# Patient Record
Sex: Female | Born: 2000
Health system: Southern US, Community
[De-identification: ages and names within clinical notes are randomized; demographics above are authoritative.]

## PROBLEM LIST (undated history)

## (undated) DIAGNOSIS — L709 Acne, unspecified: Secondary | ICD-10-CM

## (undated) HISTORY — DX: Acne, unspecified: L70.9

---
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
## 2023-03-30 ENCOUNTER — Emergency Department (HOSPITAL_BASED_OUTPATIENT_CLINIC_OR_DEPARTMENT_OTHER)
Admission: EM | Admit: 2023-03-30 | Discharge: 2023-03-30 | Disposition: A | Discharge status: Home / Self Care | Payer: Self-pay | Attending: Emergency Medicine | Admitting: Emergency Medicine

## 2023-03-30 ENCOUNTER — Encounter (HOSPITAL_BASED_OUTPATIENT_CLINIC_OR_DEPARTMENT_OTHER): Payer: Self-pay | Admitting: Urology

## 2023-03-30 ENCOUNTER — Emergency Department (HOSPITAL_BASED_OUTPATIENT_CLINIC_OR_DEPARTMENT_OTHER): Payer: Self-pay

## 2023-03-30 ENCOUNTER — Other Ambulatory Visit: Payer: Self-pay

## 2023-03-30 DIAGNOSIS — R002 Palpitations: Secondary | ICD-10-CM

## 2023-03-30 DIAGNOSIS — F419 Anxiety disorder, unspecified: Secondary | ICD-10-CM | POA: Insufficient documentation

## 2023-03-30 DIAGNOSIS — R079 Chest pain, unspecified: Secondary | ICD-10-CM

## 2023-03-30 LAB — HCG, QUANTITATIVE, PREGNANCY: hCG, Beta Chain, Quant, S: <1 m[IU]/mL (ref <5)

## 2023-03-30 LAB — BASIC METABOLIC PANEL
Anion gap: 9 (ref 5–15)
BUN: 12 mg/dL (ref 6–20)
CO2: 23 mmol/L (ref 22–32)
Calcium: 9.7 mg/dL (ref 8.9–10.3)
Chloride: 104 mmol/L (ref 98–111)
Creatinine, Ser: 0.9 mg/dL (ref 0.44–1.00)
GFR, Estimated: >60 mL/min (ref >60)
Glucose, Bld: 78 mg/dL (ref 70–99)
Potassium: 3.8 mmol/L (ref 3.5–5.1)
Sodium: 136 mmol/L (ref 135–145)

## 2023-03-30 LAB — CBC
HCT: 45.2 % (ref 36.0–46.0)
Hemoglobin: 16 g/dL — ABNORMAL HIGH (ref 12.0–15.0)
MCH: 29.5 pg (ref 26.0–34.0)
MCHC: 35.4 g/dL (ref 30.0–36.0)
MCV: 83.2 fL (ref 80.0–100.0)
Platelets: 367 10*3/uL (ref 150–400)
RBC: 5.43 MIL/uL — ABNORMAL HIGH (ref 3.87–5.11)
RDW: 12.6 % (ref 11.5–15.5)
WBC: 7.7 10*3/uL (ref 4.0–10.5)
nRBC: 0 % (ref 0.0–0.2)

## 2023-03-30 LAB — MAGNESIUM: Magnesium: 2 mg/dL (ref 1.7–2.4)

## 2023-03-30 LAB — TROPONIN I (HIGH SENSITIVITY): Troponin I (High Sensitivity): <2 ng/L (ref <18)

## 2023-03-30 LAB — D-DIMER, QUANTITATIVE: D-Dimer, Quant: <0.27 ug{FEU}/mL (ref 0.00–0.50)

## 2023-03-30 NOTE — ED Triage Notes (Signed)
 Pt states central shocking pain to central chest that has been on and off x 1 week  Denies pain at this time  Denies fever or cough   No previous cardiac history

## 2023-03-30 NOTE — ED Provider Notes (Signed)
 [place] [facility] AT [facility] Provider Note   CSN: [id number] Arrival date & time: 03/30/23  1313     History  Chief Complaint  Patient presents with   Chest Pain    Melanie Horne is a 23 y.o. female with hx of anxiety and depression who presents to the ED complaining of intermittent chest pain x 1 week. Feels like "shocking" pain in the center of her chest, with associated palpitations, lightheadedness, and increased anxiety. No pain at present. Denies any other associated symptoms. Denies any prior cardiac hx.   Patient vapes "quite a bit".  Also endorses frequent caffeine use.  Denies any recent long travel.  She is on OCPs.  Denies history of blood clots.   Chest Pain Associated symptoms: palpitations        Home Medications Prior to Admission medications   Medication Sig Start Date End Date Taking? Authorizing Provider  cetirizine (ZYRTEC) 10 MG tablet Take 10 mg by mouth daily. 10/02/20   [provider]  famotidine (PEPCID) 20 MG tablet Take 20 mg by mouth 2 (two) times daily. 10/02/20   [provider]  FLUoxetine (PROZAC) 20 MG tablet Take 1 tablet (20 mg total) by mouth daily. 10/04/20   Wynn Banker, MD  hydrOXYzine (ATARAX/VISTARIL) 25 MG tablet Take 1 tablet (25 mg total) by mouth 3 (three) times daily as needed. 10/04/20   Wynn Banker, MD  metroNIDAZOLE (FLAGYL) 500 MG tablet Take 1 tablet (500 mg total) by mouth 2 (two) times daily. 12/03/20   Blue, Soijett A, PA-C  Norgestimate-Ethinyl Estradiol Triphasic (TRI-ESTARYLLA) 0.18/0.215/0.25 MG-35 MCG tablet Take 1 tablet by mouth daily. 12/27/21   Karie Georges, MD  tretinoin (RETIN-A) 0.1 % cream Apply topically at bedtime. 02/02/21   Burchette, Elberta Fortis, MD      Allergies    Patient has no known allergies.    Review of Systems   Review of Systems  Cardiovascular:  Positive for chest pain and palpitations.  Neurological:  Positive for  light-headedness.  Psychiatric/Behavioral:  The patient is nervous/anxious.   All other systems reviewed and are negative.   Physical Exam Updated Vital Signs BP 107/82   Pulse 82   Temp 97.6 F (36.4 C) (Oral)   Resp 14   Ht 5\' 5"  (1.651 m)   Wt 58.8 kg   LMP 03/27/2023   SpO2 100%   BMI 21.57 kg/m  Physical Exam Vitals and nursing note reviewed.  Constitutional:      Appearance: Normal appearance.  HENT:     Head: Normocephalic and atraumatic.  Eyes:     Conjunctiva/sclera: Conjunctivae normal.  Cardiovascular:     Rate and Rhythm: Regular rhythm. Tachycardia present.  Pulmonary:     Effort: Pulmonary effort is normal. No respiratory distress.     Breath sounds: Normal breath sounds.  Abdominal:     General: There is no distension.     Palpations: Abdomen is soft.     Tenderness: There is no abdominal tenderness.  Musculoskeletal:     Right lower leg: No edema.     Left lower leg: No edema.  Skin:    General: Skin is warm and dry.  Neurological:     General: No focal deficit present.     Mental Status: She is alert.  Psychiatric:        Mood and Affect: Affect normal. Mood is anxious.     ED Results / Procedures / Treatments  Labs (all labs ordered are listed, but only abnormal results are displayed) Labs Reviewed  CBC - Abnormal; Notable for the following components:      Result Value   RBC 5.43 (*)    Hemoglobin 16.0 (*)    All other components within normal limits  BASIC METABOLIC PANEL  MAGNESIUM  HCG, QUANTITATIVE, PREGNANCY  D-DIMER, QUANTITATIVE  TROPONIN I (HIGH SENSITIVITY)    EKG EKG Interpretation Date/Time:  Sunday March 30 2023 13:24:44 EDT Ventricular Rate:  89 PR Interval:  105 QRS Duration:  103 QT Interval:  350 QTC Calculation: 426 R Axis:   85  Text Interpretation: Sinus rhythm Short PR interval Right atrial enlargement Borderline Q waves in lateral leads Minimal ST depression, inferior leads Baseline wander in lead(s) II  III aVF V4 No previous ECGs available Confirmed by Alvira Monday ([id number]) on 03/30/2023 2:46:05 PM  Radiology DG Chest 2 View Result Date: 03/30/2023 CLINICAL DATA:  Chest pain. EXAM: CHEST - 2 VIEW COMPARISON:  Two-view chest x-ray 11/21/2005 FINDINGS: The heart size and mediastinal contours are within normal limits. Both lungs are clear. The visualized skeletal structures are unremarkable. IMPRESSION: No acute cardiopulmonary disease. Electronically Signed   By: Marin Roberts M.D.   On: 03/30/2023 13:45    Procedures Procedures  Discussed smoking cessation with patient and was they were offerred resources to help stop.  Total time was 5 min CPT code 60454.   Medications Ordered in ED Medications - No data to display  ED Course/ Medical Decision Making/ A&P                                 Medical Decision Making Amount and/or Complexity of Data Reviewed Labs: ordered. Radiology: ordered.  This patient is a 23 y.o. female  who presents to the ED for concern of chest pain, palpitations.   Differential diagnoses prior to evaluation: The emergent differential diagnosis includes, but is not limited to,  Cardiac arrhythmias, ACS, CHF, pericarditis, valvular disease, panic/anxiety, ETOH, stimulant use, medication side effect, anemia, hyperthyroidism, pulmonary embolism. This is not an exhaustive differential.   Past Medical History / Co-morbidities / Social History: Anxiety, depression Tobacco use  Physical Exam: Physical exam performed. The pertinent findings include: Tachycardic, otherwise normal vital signs.  No acute distress.  Lung sounds clear.  No peripheral edema.  Lab Tests/Imaging studies: I personally interpreted labs/imaging and the pertinent results include: CBC with mildly elevated hemoglobin, BMP normal.  Normal magnesium.  Negative troponin.  Negative D-dimer. Negative pregnancy.  Chest x-ray without acute abnormalities I agree with the radiologist  interpretation.  Cardiac monitoring: EKG obtained and interpreted by myself and attending physician which shows: sinus rhythm   Disposition: After consideration of the diagnostic results and the patients response to treatment, I feel that emergency department workup does not suggest an emergent condition requiring admission or immediate intervention beyond what has been performed at this time. The plan is: discharge with cardiology follow up.  With patient having as significant symptoms that she is, I think that she would benefit from wearing a Holter monitor.  Discussed that she could follow-up with her PCP regarding this, but she has not seen her PCP in quite some time, getting cardiology referral.  Encouraged PCP follow up to include possible thyroid testing, but overall I have low concern for emergent endocrine pathology as patient is clinically very well-appearing, reassuring vital signs and laboratory evaluation.  Discussed decreasing  tobacco/vape and caffeine usage, as well as stress reduction.  The patient is safe for discharge and has been instructed to return immediately for worsening symptoms, change in symptoms or any other concerns.  Final Clinical Impression(s) / ED Diagnoses Final diagnoses:  Nonspecific chest pain  Palpitations    Rx / DC Orders ED Discharge Orders          Ordered    Ambulatory referral to Cardiology       Comments: If you have not heard from the Cardiology office within the next 72 hours please call [phone].   03/30/23 1506           Portions of this report may have been transcribed using voice recognition software. Every effort was made to ensure accuracy; however, inadvertent computerized transcription errors may be present.    Su Monks, PA-C 03/30/23 1524    Alvira Monday, MD 04/07/23 1511

## 2023-03-30 NOTE — Discharge Instructions (Addendum)
 You were seen in the emergency department today for chest pain and palpitations.  As we discussed all your lab work and imaging looked very reassuring today.  We did not find any evidence of heart attack, blood clot in your lungs, anemia, or any electrolyte disturbance.  I recommend following up with your primary care doctor as well as a cardiologist, and have sent a referral for you.  Your primary care doctor can order some other testing such as thyroid studies.  There are certain lifestyle modifications that you can make that can help with your symptoms, such as cutting back on vaping/tobacco use, caffeine use, and reducing stress.  Continue to monitor how you're doing and return to the ER for new or worsening symptoms.

## 2023-05-06 IMAGING — CT CT RENAL STONE PROTOCOL
2 of 4 series · 16 of 46 positions shown, 18 images · non-contrast
Comparison: None.

CLINICAL DATA: Left flank pain, dysuria

EXAM:
CT ABDOMEN AND PELVIS WITHOUT CONTRAST
TECHNIQUE: Multidetector CT imaging of the abdomen and pelvis was performed
following the standard protocol without IV contrast.

[Series 2: axial st · axial · 0.62mm/px · z∈[+916,+1346]mm · 13 of 98 slices shown, 15 images]
[im 6/98  soft-tissue]
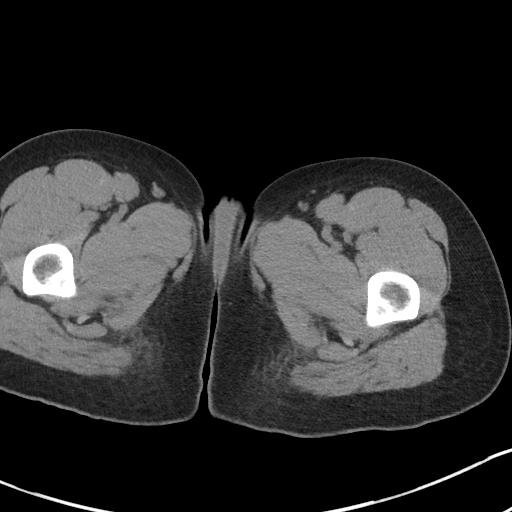
[im 6/98  bone]
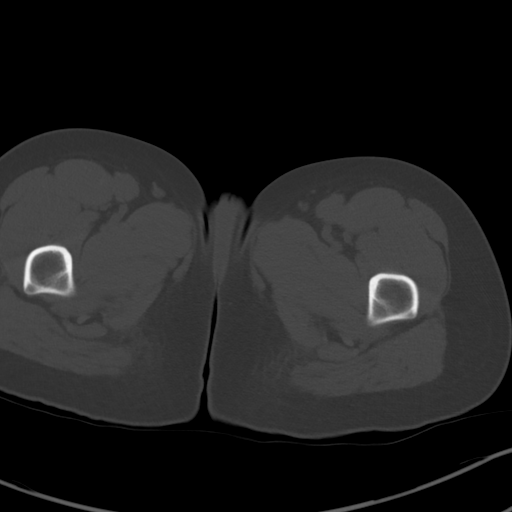
[im 11/98  soft-tissue]
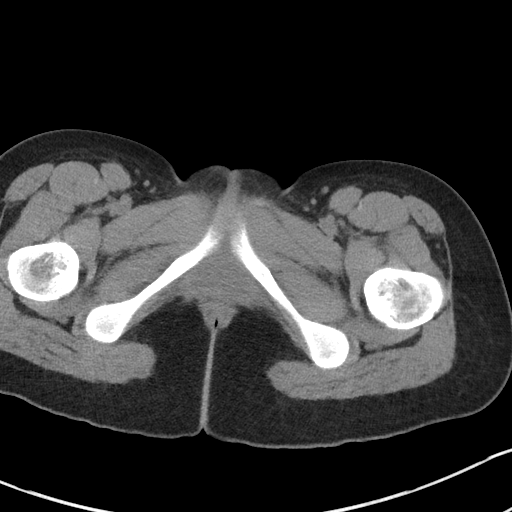
[im 22/98  soft-tissue]
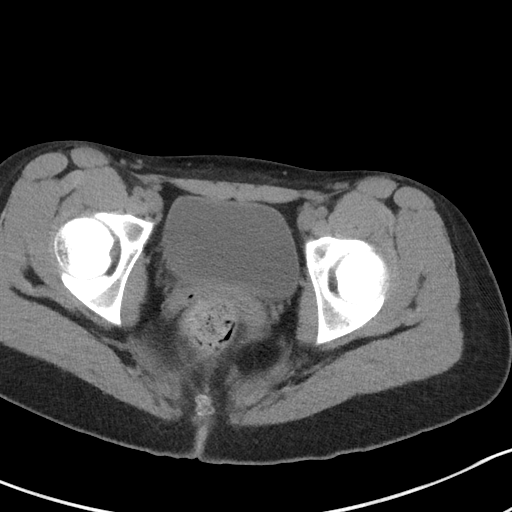
[im 27/98  soft-tissue]
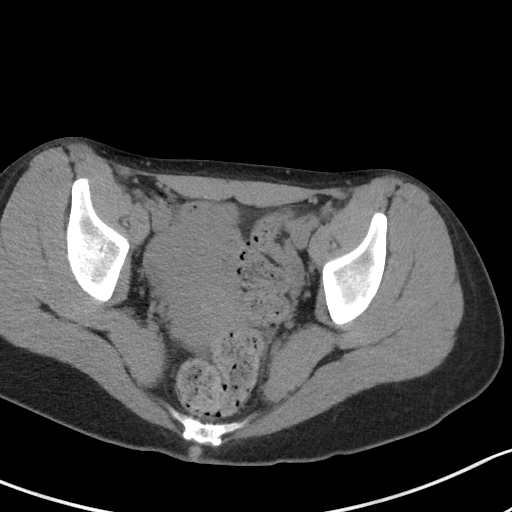
[im 33/98  soft-tissue]
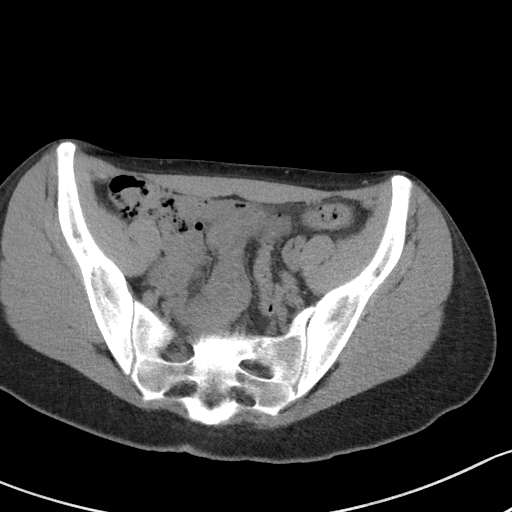
[im 44/98  soft-tissue]
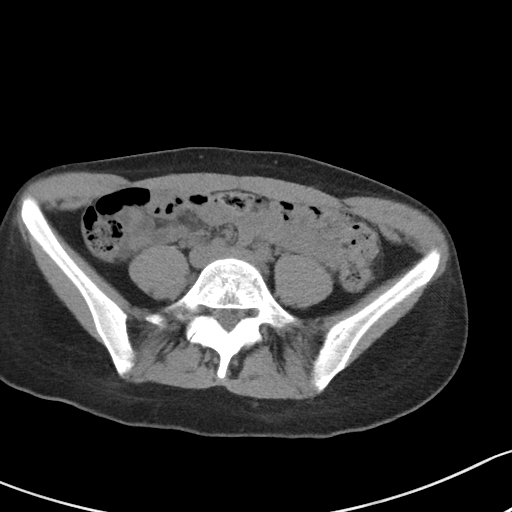
[im 49/98  soft-tissue]
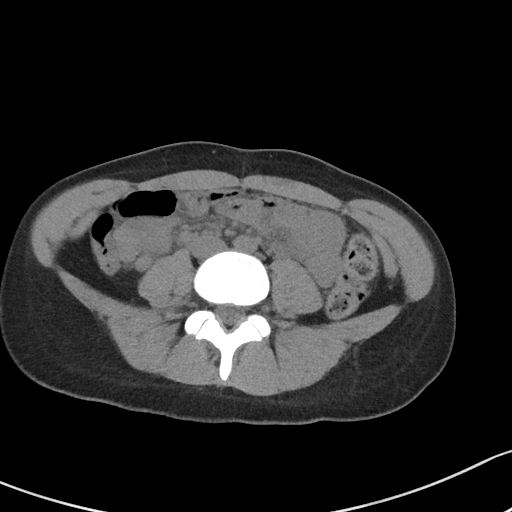
[im 54/98  soft-tissue]
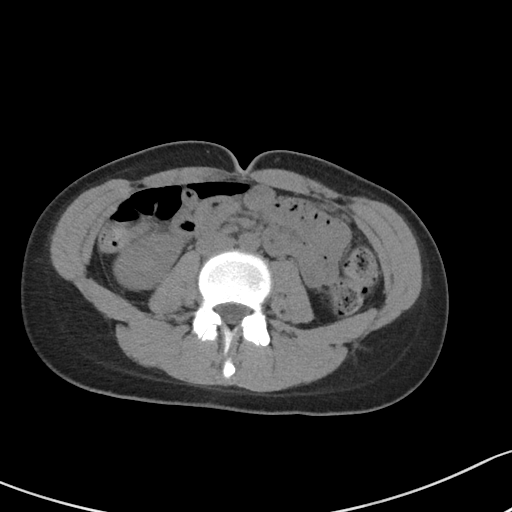
[im 65/98  soft-tissue]
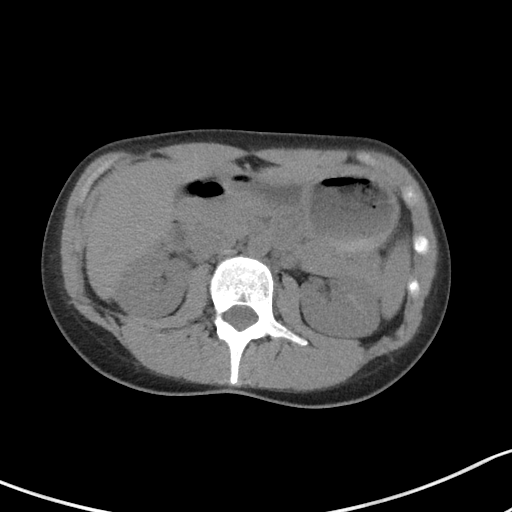
[im 65/98  bone]
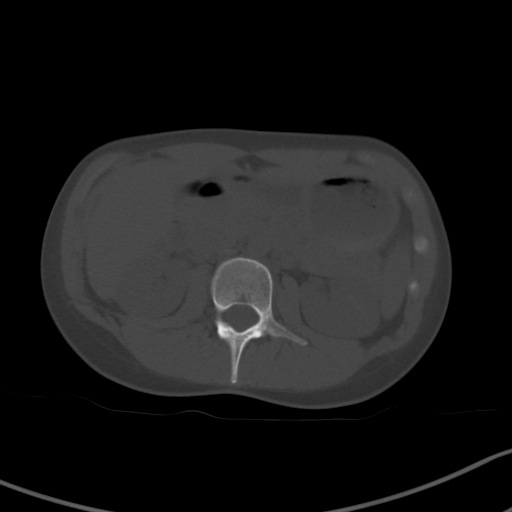
[im 71/98  soft-tissue]
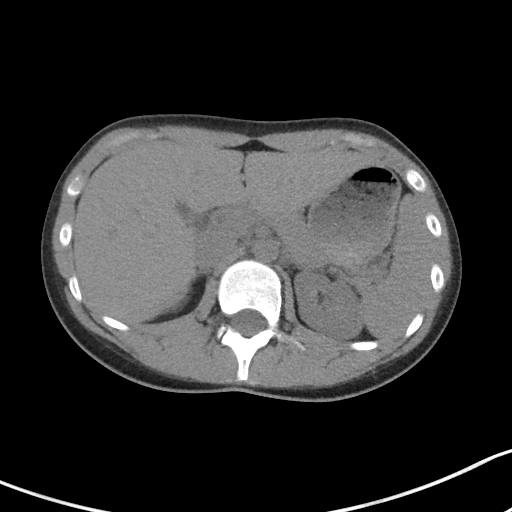
[im 76/98  soft-tissue]
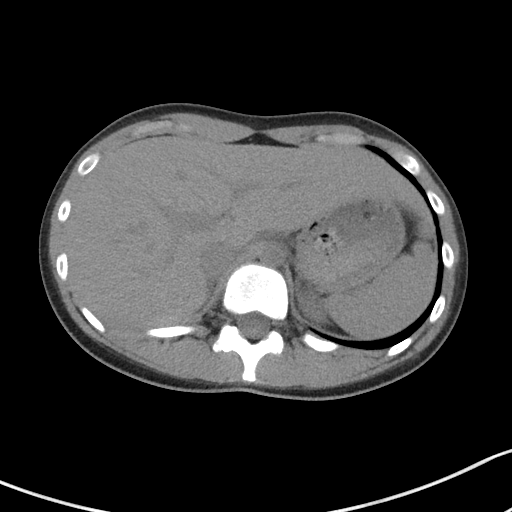
[im 87/98  soft-tissue]
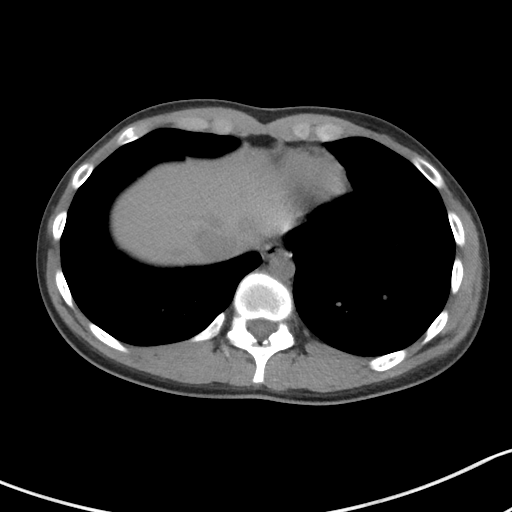
[im 92/98  soft-tissue]
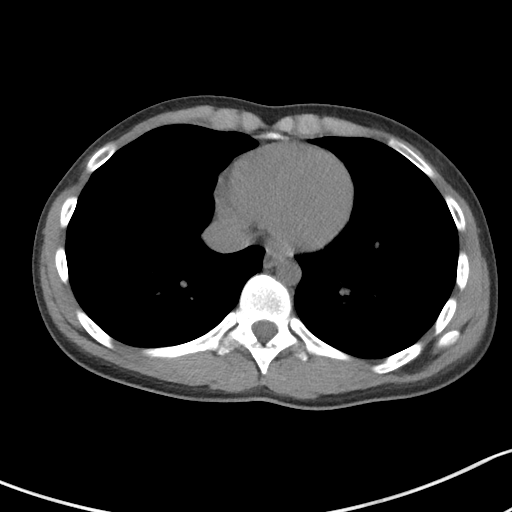

[Series 5: coronal st · coronal · 0.80mm/px · 3 of 83 slices shown]
[im 28/83  soft-tissue]
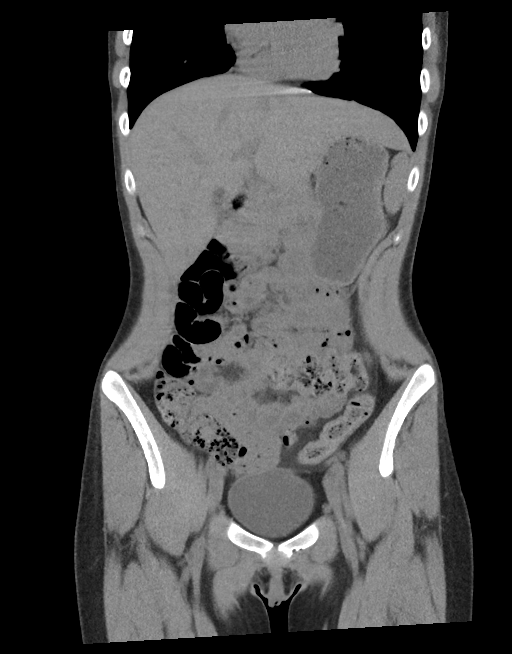
[im 37/83  soft-tissue]
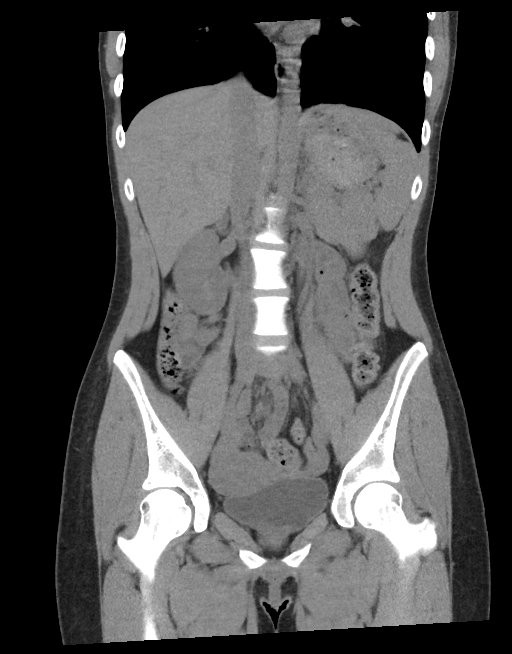
[im 46/83  soft-tissue]
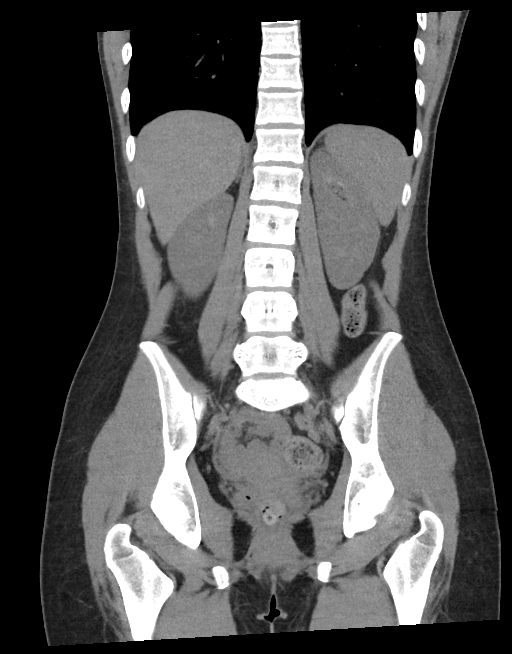

[16 of 46 positions shown; findings below may reference images not displayed]

FINDINGS: Lower chest: Lung bases are clear.

Hepatobiliary: Unenhanced liver is unremarkable.

Gallbladder is unremarkable. No intrahepatic or extrahepatic ductal
dilatation.

Pancreas: Within normal limits.

Spleen: Within normal limits.

Adrenals/Urinary Tract: Adrenal glands are within normal limits.

Kidneys are within normal limits. No renal, ureteral, or bladder
calculi. No hydronephrosis.

Bladder is within normal limits.

Stomach/Bowel: Stomach is within normal limits.

No evidence of bowel obstruction.

Normal appendix (series 2/image 60).

No colonic wall thickening or inflammatory changes.

Vascular/Lymphatic: No evidence of abdominal aortic aneurysm.

No suspicious abdominopelvic lymphadenopathy.

Reproductive: Uterus is within normal limits.

Bilateral ovaries are within normal limits.

Other: Trace pelvic ascites (series 2/image 73).

Faint stranding along the left anterior pelvic mesentery (series
2/images 65 and 71), nonspecific. However, this appearance can be
seen in the setting of PID.

Musculoskeletal: Visualized osseous structures are within normal
limits.
IMPRESSION: Faint stranding along the left anterior pelvic mesentery,
nonspecific, but sometimes seen in the setting of PID.

Otherwise negative CT abdomen/pelvis.

## 2023-05-22 ENCOUNTER — Encounter: Payer: Self-pay | Admitting: *Deleted

## 2023-05-22 ENCOUNTER — Ambulatory Visit: Payer: Self-pay

## 2023-08-06 ENCOUNTER — Encounter (HOSPITAL_BASED_OUTPATIENT_CLINIC_OR_DEPARTMENT_OTHER): Payer: Self-pay

## 2023-08-06 ENCOUNTER — Emergency Department (HOSPITAL_BASED_OUTPATIENT_CLINIC_OR_DEPARTMENT_OTHER)
Admission: EM | Admit: 2023-08-06 | Discharge: 2023-08-06 | Disposition: A | Discharge status: Home / Self Care | Payer: Self-pay

## 2023-08-06 ENCOUNTER — Emergency Department (HOSPITAL_BASED_OUTPATIENT_CLINIC_OR_DEPARTMENT_OTHER): Payer: Self-pay

## 2023-08-06 ENCOUNTER — Other Ambulatory Visit: Payer: Self-pay

## 2023-08-06 DIAGNOSIS — W19XXXA Unspecified fall, initial encounter: Secondary | ICD-10-CM | POA: Insufficient documentation

## 2023-08-06 DIAGNOSIS — Y9339 Activity, other involving climbing, rappelling and jumping off: Secondary | ICD-10-CM | POA: Diagnosis not present

## 2023-08-06 DIAGNOSIS — M25572 Pain in left ankle and joints of left foot: Secondary | ICD-10-CM | POA: Diagnosis present

## 2023-08-06 MED ORDER — CELECOXIB 200 MG PO CAPS: 200.0000 mg | ORAL_CAPSULE | Freq: Two times a day (BID) | ORAL | 0 refills | Status: AC | PRN

## 2023-08-06 NOTE — ED Notes (Signed)
 Reviewed discharge instructions, follow up, and medications with pt. Pt states understanding. Lace up brace applied and fitted for crutches. Pt ambulatory with crutches at discharge

## 2023-08-06 NOTE — Discharge Instructions (Signed)
 As discussed, your x-rays do not show obvious fracture or dislocation.  Suspect that you have an acute sprain.  Recommend rest and ice, elevation, anti-inflammatories.  Wear brace as needed for support of the ankle as well as use crutches as needed to help rest the left ankle.  You are able to walk whenever your symptoms are tolerable.  Recommend follow-up with PCP or orthopedic in the outpatient setting.

## 2023-08-06 NOTE — ED Triage Notes (Signed)
 Pt reports she jumped up and landed wrong on left foot/ankle . Pain in ankle and lateral foot. Feels numb

## 2023-08-06 NOTE — ED Provider Notes (Signed)
 [place] [facility] [place] Provider [place]   CSN: [id number] Arrival date & time: 08/06/23  1126     Patient presents with: No chief complaint on file.   Melanie Horne is a 23 y.o. female.   HPI   23 year old female presents emergency department with complaints of left ankle pain.  States that she got excited yesterday and jumped up and inverted her left ankle causing her to fall.  States since then, has been able to bear weight but has had pain in her left ankle/foot.  Does report some tingling/numbness type sensation in the outside of her left foot.  Denies any pain/trauma elsewhere.  Presents emergency department for further assessment.  Past medical history significant for single seizure  Prior to Admission medications   Medication Sig Start Date End Date Taking? Authorizing Provider  cetirizine (ZYRTEC) 10 MG tablet Take 10 mg by mouth daily. 10/02/20   [provider]  famotidine (PEPCID) 20 MG tablet Take 20 mg by mouth 2 (two) times daily. 10/02/20   [provider]  FLUoxetine  (PROZAC ) 20 MG tablet Take 1 tablet (20 mg total) by mouth daily. 10/04/20   Koberlein, Junell C, MD  hydrOXYzine  (ATARAX /VISTARIL ) 25 MG tablet Take 1 tablet (25 mg total) by mouth 3 (three) times daily as needed. 10/04/20   Koberlein, Junell C, MD  metroNIDAZOLE  (FLAGYL ) 500 MG tablet Take 1 tablet (500 mg total) by mouth 2 (two) times daily. 12/03/20   Blue, Soijett A, PA-C  Norgestimate-Ethinyl Estradiol Triphasic (TRI-ESTARYLLA) 0.18/0.215/0.25 MG-35 MCG tablet Take 1 tablet by mouth daily. 12/27/21   Ozell Heron HERO, MD  tretinoin  (RETIN-A ) 0.1 % cream Apply topically at bedtime. 02/02/21   Burchette, Wolm ORN, MD    Allergies: Patient has no known allergies.    Review of Systems  All other systems reviewed and are negative.   Updated Vital Signs There were no vitals taken for this visit.  Physical Exam Vitals and nursing note reviewed.   Constitutional:      General: She is not in acute distress.    Appearance: She is well-developed.  HENT:     Head: Normocephalic and atraumatic.  Eyes:     Conjunctiva/sclera: Conjunctivae normal.  Cardiovascular:     Rate and Rhythm: Normal rate and regular rhythm.     Heart sounds: No murmur heard. Pulmonary:     Effort: Pulmonary effort is normal. No respiratory distress.     Breath sounds: Normal breath sounds.  Abdominal:     Palpations: Abdomen is soft.     Tenderness: There is no abdominal tenderness.  Musculoskeletal:        General: No swelling.     Cervical back: Neck supple.     Comments: Range of motion bilateral lower extremities with 5 out of 5 muscular strength bilaterally.  Pedal +2 DP pulses 2+ bilaterally.  Tender palpation left anterior lateral malleolus, base of fifth metatarsal.  Mild superficial swelling in the area.  No erythema, palpable fluctuance/induration.  Skin:    General: Skin is warm and dry.     Capillary Refill: Capillary refill takes less than 2 seconds.  Neurological:     Mental Status: She is alert.  Psychiatric:        Mood and Affect: Mood normal.     (all labs ordered are listed, but only abnormal results are displayed) Labs Reviewed - No data to display  EKG: None  Radiology: No results found.   Procedures  Medications Ordered in the ED - No data to display                                  Medical Decision Making Amount and/or Complexity of Data Reviewed Radiology: ordered.  Risk Prescription drug management.   This patient presents to the ED for concern of ankle pain, this involves an extensive number of treatment options, and is a complaint that carries with it a high risk of complications and morbidity.  The differential diagnosis includes fracture, strain/sprain, dislocation, ligamentous/tendinous injury, neurovascular compromise, arthritis other   Co morbidities that complicate the patient evaluation  See  HPI   Additional history obtained:  Additional history obtained from EMR External records from outside source obtained and reviewed including hospital records   Lab Tests:  N/a   Imaging Studies ordered:  I ordered imaging studies including left foot/ankle x-ray I independently visualized and interpreted imaging which showed negative I agree with the radiologist interpretation   Cardiac Monitoring: / EKG:  N/a   Consultations Obtained:  N/a   Problem List / ED Course / Critical interventions / Medication management  Left foot/ankle pain Reevaluation of the patient showed that the patient stayed the same I have reviewed the patients home medicines and have made adjustments as needed   Social Determinants of Health:  Denies tobacco, drug use.   Test / Admission - Considered:  Left foot/ankle pain Vitals signs within normal range and stable throughout visit. Imaging studies significant for: See Above 23 year old female presents emergency department with complaints of left ankle pain.  States that she got excited yesterday and jumped up and inverted her left ankle causing her to fall.  States since then, has been able to bear weight but has had pain in her left ankle/foot.  Does report some tingling/numbness type sensation in the outside of her left foot.  Denies any pain/trauma elsewhere.  Presents emergency department for further assessment. On exam, tenderness to palpation region of ATFL left foot/ankle.  No pulse deficits or associated plan.  No overlying skin changes concerning for  second infectious process.  X-rays obtained which were negative for any acute osseous abnormality.  No lower extremity edema concerning for DVT suspect ankle sprain.  Recommend rest, ice, elevation, anti-inflammatories.  Patient fitted with ASO lace up brace as well as participate in ambulation.  Recommend follow-up with PCP/Ortho in the outpatient setting for reassessment.  Treatment  plan discussed with patient and she acknowledges signs agreeable to the plan.  Patient well-appearing, afebrile in no acute distress. Worrisome signs and symptoms were discussed with the patient, and the patient acknowledged understanding to return to the ED if noticed. Patient was stable upon discharge.       Final diagnoses:  None    ED Discharge Orders     None          Silver Wonda LABOR, [place] 08/06/23 1406    Gennaro Bouchard L, DO 08/08/23 1336

## 2023-10-09 LAB — LAB REPORT - SCANNED
A1c: 5.2
HM HIV Screening: NEGATIVE

## 2023-12-30 ENCOUNTER — Other Ambulatory Visit: Payer: Self-pay

## 2023-12-30 DIAGNOSIS — Z148 Genetic carrier of other disease: Secondary | ICD-10-CM

## 2024-02-04 ENCOUNTER — Encounter: Payer: Self-pay | Admitting: *Deleted

## 2024-02-04 DIAGNOSIS — E559 Vitamin D deficiency, unspecified: Secondary | ICD-10-CM | POA: Insufficient documentation

## 2024-02-04 DIAGNOSIS — Z148 Genetic carrier of other disease: Secondary | ICD-10-CM | POA: Insufficient documentation

## 2024-02-04 DIAGNOSIS — D259 Leiomyoma of uterus, unspecified: Secondary | ICD-10-CM | POA: Insufficient documentation

## 2024-02-18 ENCOUNTER — Ambulatory Visit

## 2024-02-18 ENCOUNTER — Other Ambulatory Visit: Payer: Self-pay
